# Patient Record
Sex: Female | Born: 1985 | Race: White | Hispanic: No | Marital: Married | Smoking: Never smoker
Health system: Southern US, Community
[De-identification: ages and names within clinical notes are randomized; demographics above are authoritative.]

## PROBLEM LIST (undated history)

## (undated) DIAGNOSIS — I82409 Acute embolism and thrombosis of unspecified deep veins of unspecified lower extremity: Secondary | ICD-10-CM

## (undated) DIAGNOSIS — T7840XA Allergy, unspecified, initial encounter: Secondary | ICD-10-CM

## (undated) DIAGNOSIS — E079 Disorder of thyroid, unspecified: Secondary | ICD-10-CM

## (undated) DIAGNOSIS — I2699 Other pulmonary embolism without acute cor pulmonale: Secondary | ICD-10-CM

## (undated) HISTORY — DX: Allergy, unspecified, initial encounter: T78.40XA

---
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
## 2014-10-13 HISTORY — PX: LASIK: SHX215

## 2014-10-13 HISTORY — PX: LAPAROSCOPY: SHX197

## 2016-10-13 DIAGNOSIS — I82409 Acute embolism and thrombosis of unspecified deep veins of unspecified lower extremity: Secondary | ICD-10-CM

## 2016-10-13 HISTORY — DX: Acute embolism and thrombosis of unspecified deep veins of unspecified lower extremity: I82.409

## 2017-09-01 ENCOUNTER — Other Ambulatory Visit: Payer: Self-pay | Admitting: Family Medicine

## 2017-09-01 DIAGNOSIS — I824Y1 Acute embolism and thrombosis of unspecified deep veins of right proximal lower extremity: Secondary | ICD-10-CM

## 2017-09-11 ENCOUNTER — Encounter (INDEPENDENT_AMBULATORY_CARE_PROVIDER_SITE_OTHER): Payer: Self-pay

## 2017-09-11 ENCOUNTER — Ambulatory Visit
Admission: RE | Admit: 2017-09-11 | Discharge: 2017-09-11 | Disposition: A | Discharge status: Home / Self Care | Payer: Commercial Managed Care - PPO | Source: Ambulatory Visit | Attending: Family Medicine | Admitting: Family Medicine

## 2017-09-11 DIAGNOSIS — I824Y1 Acute embolism and thrombosis of unspecified deep veins of right proximal lower extremity: Secondary | ICD-10-CM

## 2017-09-11 MED ORDER — GADOBENATE DIMEGLUMINE 529 MG/ML IV SOLN: 20.0000 mL | Freq: Once | INTRAVENOUS | Status: DC | PRN

## 2018-03-18 ENCOUNTER — Encounter (HOSPITAL_COMMUNITY): Payer: Self-pay | Admitting: Family Medicine

## 2018-03-18 ENCOUNTER — Ambulatory Visit (HOSPITAL_COMMUNITY)
Admission: EM | Admit: 2018-03-18 | Discharge: 2018-03-18 | Disposition: A | Discharge status: Home / Self Care | Payer: Commercial Managed Care - PPO | Attending: Family Medicine | Admitting: Family Medicine

## 2018-03-18 DIAGNOSIS — R42 Dizziness and giddiness: Secondary | ICD-10-CM | POA: Diagnosis not present

## 2018-03-18 DIAGNOSIS — Z3202 Encounter for pregnancy test, result negative: Secondary | ICD-10-CM

## 2018-03-18 HISTORY — DX: Other pulmonary embolism without acute cor pulmonale: I26.99

## 2018-03-18 HISTORY — DX: Disorder of thyroid, unspecified: E07.9

## 2018-03-18 LAB — POCT PREGNANCY, URINE: PREG TEST UR: NEGATIVE

## 2018-03-18 LAB — POCT I-STAT, CHEM 8
BUN: 14 mg/dL (ref 6–20)
CALCIUM ION: 1.29 mmol/L (ref 1.15–1.40)
CHLORIDE: 105 mmol/L (ref 101–111)
Creatinine, Ser: 0.8 mg/dL (ref 0.44–1.00)
Glucose, Bld: 87 mg/dL (ref 65–99)
HEMATOCRIT: 37 % (ref 36.0–46.0)
Hemoglobin: 12.6 g/dL (ref 12.0–15.0)
POTASSIUM: 4.2 mmol/L (ref 3.5–5.1)
SODIUM: 143 mmol/L (ref 135–145)
TCO2: 27 mmol/L (ref 22–32)

## 2018-03-18 NOTE — Discharge Instructions (Signed)
Please increase your fluid intake. Rest, activity as tolerated.  If you develop any worsening of symptoms, increased dizziness, increased leg pain, headache, chest pain , shortness of breath , confusion, weakness or otherwise worsening please return or go to Er.

## 2018-03-18 NOTE — ED Provider Notes (Signed)
[facility]    CSN: [id number] Arrival date & time: 03/18/18  1808     History   Chief Complaint Chief Complaint  Patient presents with  . Dizziness    HPI Holly Fry is a 32 y.o. female.   Holly Fry presents with complaints of sensation of light headedness and "wooziness," which started over the past 36 hours and is worse with activity. 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 She is on xarelto. 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 Has been on xarelto since. No fevers. Has been eating and drinking. Has been on vicodin for pain, last took this morning. Denies constipation. Slight headache but has been under increased stress. No cough, shortness of breath , chest pain , URI symptoms, nausea, vomiting, diarrhea. No increased pain, redness, swelling or sensation changes to leg. Feels it has been improving well. Wearing compression sleeve to left leg as directed. Hx of PE as well as thyroid disease.   ROS per HPI.      Past Medical History:  Diagnosis Date  . PE (pulmonary thromboembolism) (HCC)   . Thyroid disease     There are no active problems to display for this patient.   History reviewed. No pertinent surgical history.  OB History   None      Home Medications    Prior to Admission medications   Medication Sig Start Date End Date Taking? Authorizing Provider  levothyroxine (SYNTHROID, LEVOTHROID) 112 MCG tablet Take 112 mcg by mouth daily before breakfast.   Yes [provider]  meloxicam (MOBIC) 7.5 MG tablet Take 7.5 mg by mouth daily.   Yes [provider]  rivaroxaban (XARELTO) 20 MG TABS tablet Take 20 mg by mouth daily with supper.   Yes [provider]    Family History History reviewed. No pertinent family history.  Social History Social History   Tobacco Use  . Smoking status: Never Smoker  . Smokeless tobacco: Never Used  Substance Use Topics  . Alcohol use:  Not on file  . Drug use: Not on file     Allergies   Patient has no known allergies.   Review of Systems Review of Systems   Physical Exam Triage Vital Signs ED Triage Vitals  Enc Vitals Group     BP 03/18/18 1903 124/62     Pulse Rate 03/18/18 1903 74     Resp 03/18/18 1903 18     Temp 03/18/18 1903 98.5 F (36.9 C)     Temp Source 03/18/18 1903 Oral     SpO2 03/18/18 1903 100 %     Weight --      Height --      Head Circumference --      Peak Flow --      Pain Score 03/18/18 1904 4     Pain Loc --      Pain Edu? --      Excl. in GC? --    Orthostatic VS for the past 24 hrs:  BP- Lying Pulse- Lying BP- Sitting Pulse- Sitting BP- Standing at 0 minutes Pulse- Standing at 0 minutes  03/18/18 2037 107/57 63 110/65 64 109/61 84    Updated Vital Signs BP 124/62   Pulse 74   Temp 98.5 F (36.9 C) (Oral)   Resp 18   LMP 02/23/2018   SpO2 100%    Physical Exam  Constitutional: She is  oriented to person, place, and time. She appears well-developed and well-nourished. No distress.  HENT:  Head: Normocephalic and atraumatic.  Right Ear: Hearing, tympanic membrane and ear canal normal.  Left Ear: Hearing, tympanic membrane and ear canal normal.  Nose: Nose normal.  Mouth/Throat: Uvula is midline, oropharynx is clear and moist and mucous membranes are normal.  Eyes: Pupils are equal, round, and reactive to light. Conjunctivae and EOM are normal.  Cardiovascular: Normal rate, regular rhythm and normal heart sounds.  Pulmonary/Chest: Effort normal and breath sounds normal.  Musculoskeletal:  Healing bruising to left thigh; compression stocking in place. Skin visualized through stocking without any swelling or redness, non tender  Neurological: She is alert and oriented to person, place, and time. She has normal strength. No cranial nerve deficit or sensory deficit. She displays a negative Romberg sign. Coordination normal.  Felt somewhat increased dizzy with romberg but  was able to tolerate  Skin: Skin is warm and dry.     UC Treatments / Results  Labs (all labs ordered are listed, but only abnormal results are displayed) Labs Reviewed  POCT PREGNANCY, URINE  POCT I-STAT, CHEM 8    EKG None  Radiology No results found.  Procedures Procedures (including critical care time)  Medications Ordered in UC Medications - No data to display  Initial Impression / Assessment and Plan / UC Course  I have reviewed the triage vital signs and the nursing notes.  Pertinent labs & imaging results that were available during my care of the patient were reviewed by me and considered in my medical decision making (see chart for details).     Chem 8 reassuring. Afebrile. Without tachycardia or hypoxia. No increased pain to leg or chest. Orthostatic BP WNL, slight elevation in HR with standing. Question slight dehydration, has been on vicodin. Without redflag findings at this time. Discussed pe, dvt, bleed, dehydration, sinusitis as differentials. .Return precautions provided and discussed at length. Patient verbalized understanding and agreeable to plan.  Ambulatory out of clinic without difficulty.    Final Clinical Impressions(s) / UC Diagnoses   Final diagnoses:  Dizziness     Discharge Instructions     Please increase your fluid intake. Rest, activity as tolerated.  If you develop any worsening of symptoms, increased dizziness, increased leg pain, headache, chest pain , shortness of breath , confusion, weakness or otherwise worsening please return or go to Er.     ED Prescriptions    None     Controlled Substance Prescriptions [place] Controlled Substance Registry consulted? Not Applicable   Georgetta Haber, NP 03/18/18 2124

## 2018-03-18 NOTE — ED Triage Notes (Addendum)
Pt here for lightheadedness since about 36 hours ago. She had varicose vein surgery last week. Denies any chest pain or SOB. She is wearing a compression stocking. She has a hx of PE that occurred last June. She is on xarelto.

## 2018-04-02 ENCOUNTER — Encounter: Payer: Self-pay | Admitting: Medical

## 2018-04-02 ENCOUNTER — Ambulatory Visit: Payer: Commercial Managed Care - PPO | Admitting: Medical

## 2018-04-02 VITALS — BP 115/62 | HR 77 | Temp 98.1°F | Resp 16 | Ht 69.0 in | Wt 202.8 lb

## 2018-04-02 DIAGNOSIS — R1013 Epigastric pain: Secondary | ICD-10-CM | POA: Diagnosis not present

## 2018-04-02 DIAGNOSIS — E039 Hypothyroidism, unspecified: Secondary | ICD-10-CM

## 2018-04-02 DIAGNOSIS — Z86718 Personal history of other venous thrombosis and embolism: Secondary | ICD-10-CM | POA: Diagnosis not present

## 2018-04-02 DIAGNOSIS — R5383 Other fatigue: Secondary | ICD-10-CM | POA: Diagnosis not present

## 2018-04-02 MED ORDER — ONDANSETRON 8 MG PO TBDP: 8.0000 mg | ORAL_TABLET | Freq: Three times a day (TID) | ORAL | 0 refills | Status: AC | PRN

## 2018-04-02 MED ORDER — RANITIDINE HCL 150 MG PO CAPS: 150.0000 mg | ORAL_CAPSULE | Freq: Two times a day (BID) | ORAL | 0 refills | Status: DC

## 2018-04-02 NOTE — Progress Notes (Signed)
Subjective:    Patient ID: Holly Fry, female    DOB: 06-10-1986, 32 y.o.   MRN: [id number]  HPI  Pt her for first time.  Pt is from [place] originally. Just moved her last August.  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 Husband works for [employer], Pt not exercising. Pt states her diet is improved recently.    Pt has low thyroid since 2016. Pt last had thyroid level done 6 months ago. Pt is on 112 microgram of levothyroxine.   Pt has history of dvt following bed rest 10 weeks, c-section and oral contraceptive. Pt was on xarelto since last June. She saw hematologist and work up cause for DVT was negative.  Pt had saphenous vein procedures both legs to reduce chance of DVT again.(pt states procedure done with [facility] vein specialist) States prior dvt left iliac vessel region.  Pt states former hematologist states she should continue on xarelto but unclear for how long. Currently has 90 tab rx of xarelto.   Pt recently past 2 weeks  had severe gi symptoms almost immediately after eating fried foods. Pain will be epigastric and some to right  upper quadrant. Some nausea. Only on time did she vomit. Tuesday some diarrhea but that normal formed stools by Thursday.   Pt states pain abdomen after eating greasy/fried foods lasted 5-6 hours twice over past 2 weeks. Last time that happened Wednesday.   LMP- started past Sunday. Still on cycle      Review of Systems  Constitutional: Negative for chills, fatigue and fever.  Respiratory: Negative for cough, chest tightness, shortness of breath and wheezing.   Cardiovascular: Negative for chest pain and palpitations.  Gastrointestinal: Positive for nausea. Negative for abdominal distention, abdominal pain, blood in stool, diarrhea, rectal pain and vomiting.       Residual abdomen pain now.  No nausea now.  Genitourinary: Negative for dysuria, flank pain and frequency.  Musculoskeletal: Negative for back pain.  Neurological: Negative for facial asymmetry and  light-headedness.  Hematological: Negative for adenopathy. Does not bruise/bleed easily.  Psychiatric/Behavioral: Negative for behavioral problems, confusion and dysphoric mood.   Past Medical History:  Diagnosis Date  . Allergy    mild allergies in [place].  . PE (pulmonary thromboembolism) (HCC)   . Thyroid disease      Social History   Socioeconomic History  . Marital status: Married    Spouse name: Not on file  . Number of children: Not on file  . Years of education: Not on file  . Highest education level: Not on file  Occupational History  . Not on file  Social Needs  . Financial resource strain: Not on file  . Food insecurity:    Worry: Not on file    Inability: Not on file  . Transportation needs:    Medical: Not on file    Non-medical: Not on file  Tobacco Use  . Smoking status: Never Smoker  . Smokeless tobacco: Never Used  Substance and Sexual Activity  . Alcohol use: Yes    Comment: very rare alchohol use.  . Drug use: Never  . Sexual activity: Yes  Lifestyle  . Physical activity:    Days per week: Not on file    Minutes per session: Not on file  . Stress: Not on file  Relationships  . Social connections:    Talks on phone: Not on file    Gets together: Not on file    Attends religious service: Not on file  Active member of club or organization: Not on file    Attends meetings of clubs or organizations: Not on file    Relationship status: Not on file  . Intimate partner violence:    Fear of current or ex partner: Not on file    Emotionally abused: Not on file    Physically abused: Not on file    Forced sexual activity: Not on file  Other Topics Concern  . Not on file  Social History Narrative  . Not on file    History reviewed. No pertinent surgical history.  History reviewed. No pertinent family history.  No Known Allergies  Current Outpatient Medications on File Prior to Visit  Medication Sig Dispense Refill  . levothyroxine (SYNTHROID,  LEVOTHROID) 112 MCG tablet Take 112 mcg by mouth daily before breakfast.    . rivaroxaban (XARELTO) 20 MG TABS tablet Take 20 mg by mouth daily with supper.     No current facility-administered medications on file prior to visit.     BP 115/62   Pulse 77   Temp 98.1 F (36.7 C) (Oral)   Resp 16   Ht 5\' 9"  (1.753 m)   Wt 202 lb 12.8 oz (92 kg)   LMP 03/28/2018   SpO2 98%   BMI 29.95 kg/m      Objective:   Physical Exam   General Mental Status- Alert. General Appearance- Not in acute distress.   Skin General: Color- Normal Color. Moisture- Normal Moisture.  Neck Carotid Arteries- Normal color. Moisture- Normal Moisture. No carotid bruits. No JVD.  Chest and Lung Exam Auscultation: Breath Sounds:-Normal.  Cardiovascular Auscultation:Rythm- Regular. Murmurs & Other Heart Sounds:Auscultation of the heart reveals- No Murmurs.  Abdomen Inspection:-Inspeection Normal. Palpation/Percussion:Note:No mass. Palpation and Percussion of the abdomen reveal- faint epigastric and rt upper quadrant Tender, Non Distended + BS, no rebound or guarding.   Neurologic Cranial Nerve exam:- CN III-XII intact(No nystagmus), symmetric smile. Strength:- 5/5 equal and symmetric strength both upper and lower extremities.     Assessment & Plan:  For your low thyroid, we are getting a TSH level today.  If level is normal range then will give you prescriptions at same current dosage.  For history of DVT and PE, I asked that you sign a release form so we can get records from hematologist in [place].  Also please sign a release of information form so we can get surgical note from [place] vein specialist.  Regarding your fatigue, I ordered labs.  We will follow those and let you know those results.  For your abdomen pain after eating greasy foods, I ordered CBC, CMP, amylase, lipase, H. pylori breath test, and abdomen ultrasound.  Please go downstairs to radiology department to schedule the  ultrasound study.  If your abdomen pain flares again with eating meals then can use ranitidine as I prescribed and Zofran(for nausea or vomiting).  Any extreme/severe abdomen pain despite above measures then recommend ED evaluation.  Please try to avoid all greasy/fatty foods.  Follow-up in 10 to 14 days or as needed.  Esperanza Richters, PA-C

## 2018-04-02 NOTE — Patient Instructions (Signed)
Nice to meet you today.  For your low thyroid, we are getting a TSH level today.  If level is normal range then will give you prescriptions at same current dosage.  For history of DVT and PE, I asked that you sign a release form so we can get records from hematologist in [place].  Also please sign a release of information form so we can get surgical note from [place] vein specialist.  Regarding your fatigue, I ordered labs.  We will follow those and let you know those results.  For your abdomen pain after eating greasy foods, I ordered CBC, CMP, amylase, lipase, H. pylori breath test, and abdomen ultrasound.  Please go downstairs to radiology department to schedule the ultrasound study.  If your abdomen pain flares again with eating meals then can use ranitidine as I prescribed and Zofran(for nausea or vomiting).  Any extreme/severe abdomen pain despite above measures then recommend ED evaluation.  Please try to avoid all greasy/fatty foods.  Follow-up in 10 to 14 days or as needed.

## 2018-04-03 ENCOUNTER — Ambulatory Visit (HOSPITAL_BASED_OUTPATIENT_CLINIC_OR_DEPARTMENT_OTHER)
Admission: RE | Admit: 2018-04-03 | Discharge: 2018-04-03 | Disposition: A | Discharge status: Home / Self Care | Payer: Commercial Managed Care - PPO | Source: Ambulatory Visit | Attending: Medical | Admitting: Medical

## 2018-04-03 ENCOUNTER — Encounter (HOSPITAL_BASED_OUTPATIENT_CLINIC_OR_DEPARTMENT_OTHER): Payer: Self-pay

## 2018-04-03 DIAGNOSIS — R1013 Epigastric pain: Secondary | ICD-10-CM

## 2018-04-03 DIAGNOSIS — N281 Cyst of kidney, acquired: Secondary | ICD-10-CM | POA: Diagnosis not present

## 2018-04-03 HISTORY — DX: Acute embolism and thrombosis of unspecified deep veins of unspecified lower extremity: I82.409

## 2018-04-05 ENCOUNTER — Other Ambulatory Visit: Payer: Self-pay | Admitting: Medical

## 2018-04-05 ENCOUNTER — Telehealth: Payer: Self-pay

## 2018-04-05 MED ORDER — LEVOTHYROXINE SODIUM 112 MCG PO TABS: 112.0000 ug | ORAL_TABLET | Freq: Every day | ORAL | 5 refills | Status: DC

## 2018-04-05 MED ORDER — LEVOTHYROXINE SODIUM 112 MCG PO TABS: 112.0000 ug | ORAL_TABLET | Freq: Every day | ORAL | 0 refills | Status: DC

## 2018-04-05 NOTE — Addendum Note (Signed)
Addended by: Orlene OchRENCE, Luka Reisch N on: 04/05/2018 03:00 PM   Modules accepted: Orders

## 2018-04-05 NOTE — Addendum Note (Signed)
Addended by: Sandford Craze'SULLIVAN, Khylen Riolo on: 04/05/2018 07:20 PM   Modules accepted: Orders

## 2018-04-05 NOTE — Telephone Encounter (Signed)
Copied from CRM [id number]. Topic: Appointment Scheduling - Prior Auth Required for Appointment >> Mar 24, 2018  5:11 PM Jay SchlichterWeikart, Melissa J wrote: No appointment has been scheduled. Patient is requesting new patient appointment. Per scheduling protocol, this appointment requires a prior authorization prior to scheduling. She was referred by Lewis ShockAshly Chadwell , a current pt of Dr Abner GreenspanBlyth. Cb is [phone] If she does not want to take her, can she recommend someone who deals with hypo thyroidism   Route to department's PEC pool.    [place], Can you refer to another provider in the office? Thanks

## 2018-04-05 NOTE — Telephone Encounter (Signed)
Pt is scheduled with Esperanza Richtersdward Saguier 04/20/18 @ 8:40am

## 2018-04-05 NOTE — Telephone Encounter (Signed)
Request new prescription for levothyroxine 112 MCG tablet. Last refill was from a historical provider.  Provider Esperanza RichtersEdward Saguier, PA LOV 04/02/18 NOV  04/20/18  Pharmacy  [facility] # [id number]  [address] Pt has 10 tabs left, going out of town next week.  Last TSH  04/02/18

## 2018-04-07 LAB — CBC WITH DIFFERENTIAL/PLATELET
BASOS PCT: 0.9 %
Basophils Absolute: 52 cells/uL (ref 0–200)
EOS PCT: 5.5 %
Eosinophils Absolute: 319 cells/uL (ref 15–500)
HCT: 35.6 % (ref 35.0–45.0)
Hemoglobin: 11.6 g/dL — ABNORMAL LOW (ref 11.7–15.5)
Lymphs Abs: 2204 cells/uL (ref 850–3900)
MCH: 24.9 pg — ABNORMAL LOW (ref 27.0–33.0)
MCHC: 32.6 g/dL (ref 32.0–36.0)
MCV: 76.6 fL — AB (ref 80.0–100.0)
MONOS PCT: 10.4 %
MPV: 10.2 fL (ref 7.5–12.5)
NEUTROS PCT: 45.2 %
Neutro Abs: 2622 cells/uL (ref 1500–7800)
PLATELETS: 445 10*3/uL — AB (ref 140–400)
RBC: 4.65 10*6/uL (ref 3.80–5.10)
RDW: 17 % — AB (ref 11.0–15.0)
TOTAL LYMPHOCYTE: 38 %
WBC mixed population: 603 cells/uL (ref 200–950)
WBC: 5.8 10*3/uL (ref 3.8–10.8)

## 2018-04-07 LAB — COMPREHENSIVE METABOLIC PANEL
AG RATIO: 1.5 (calc) (ref 1.0–2.5)
ALKALINE PHOSPHATASE (APISO): 50 U/L (ref 33–115)
ALT: 11 U/L (ref 6–29)
AST: 15 U/L (ref 10–30)
Albumin: 4.3 g/dL (ref 3.6–5.1)
BILIRUBIN TOTAL: 0.2 mg/dL (ref 0.2–1.2)
BUN: 15 mg/dL (ref 7–25)
CALCIUM: 9.9 mg/dL (ref 8.6–10.2)
CO2: 18 mmol/L — AB (ref 20–32)
Chloride: 110 mmol/L (ref 98–110)
Creat: 0.76 mg/dL (ref 0.50–1.10)
GLUCOSE: 87 mg/dL (ref 65–99)
Globulin: 2.8 g/dL (calc) (ref 1.9–3.7)
Potassium: 4.3 mmol/L (ref 3.5–5.3)
Sodium: 145 mmol/L (ref 135–146)
Total Protein: 7.1 g/dL (ref 6.1–8.1)

## 2018-04-07 LAB — VITAMIN B12: VITAMIN B 12: 514 pg/mL (ref 200–1100)

## 2018-04-07 LAB — TSH: TSH: 2.49 mIU/L

## 2018-04-07 LAB — VITAMIN D 25 HYDROXY (VIT D DEFICIENCY, FRACTURES): Vit D, 25-Hydroxy: 32 ng/mL (ref 30–100)

## 2018-04-07 LAB — H. PYLORI BREATH TEST: H. PYLORI BREATH TEST: NOT DETECTED

## 2018-04-07 LAB — LIPASE: LIPASE: 41 U/L (ref 7–60)

## 2018-04-07 LAB — AMYLASE: Amylase: 65 U/L (ref 21–101)

## 2018-04-07 LAB — VITAMIN B1: VITAMIN B1 (THIAMINE): 16 nmol/L (ref 8–30)

## 2018-04-20 ENCOUNTER — Ambulatory Visit: Payer: Commercial Managed Care - PPO | Admitting: Medical

## 2018-04-20 ENCOUNTER — Telehealth: Payer: Self-pay | Admitting: Medical

## 2018-04-20 NOTE — Telephone Encounter (Signed)
Opened to review 

## 2018-04-20 NOTE — Telephone Encounter (Signed)
I met pt one time for new visit. Then saw in chart she is actually wanting to see Dr. Charlett Blake. Request passed to Dr. Charlett Blake. Will you coordinate with Princess and Dr. Charlett Blake if pt will see Dr. Charlett Blake. She was scheduled wth me and was a no show.  If Dr. Charlett Blake no accepting her then will you reach out to patient and see if her desire is to see a female? Would Dr. Edilia Bo or Lenna Sciara be other option.   Just requesting clarification as I saw her request to see Dr. Charlett Blake?

## 2018-04-21 NOTE — Telephone Encounter (Signed)
I am willing to see her since she is already in the office

## 2018-04-21 NOTE — Telephone Encounter (Signed)
Princess, Dr. B, can you confirm if you are willing to take this patient on? I will contact the patient based on your decision.

## 2018-04-27 NOTE — Telephone Encounter (Signed)
Called pt and she declined having Dr. Abner GreenspanBlyth as her provider. Pt states that Whole FoodsEdward Saguier was nice and she liked him. It also seems that she wouldn't be able to get in with Dr. Abner GreenspanBlyth if she needed her so she would rather stay with Ramon DredgeEdward.

## 2018-04-28 ENCOUNTER — Other Ambulatory Visit: Payer: Self-pay | Admitting: Medical

## 2018-04-30 ENCOUNTER — Encounter: Payer: Self-pay | Admitting: Medical

## 2018-05-13 ENCOUNTER — Ambulatory Visit (INDEPENDENT_AMBULATORY_CARE_PROVIDER_SITE_OTHER): Payer: Commercial Managed Care - PPO | Admitting: Medical

## 2018-05-13 ENCOUNTER — Telehealth: Payer: Self-pay | Admitting: Medical

## 2018-05-13 ENCOUNTER — Encounter: Payer: Self-pay | Admitting: Medical

## 2018-05-13 VITALS — BP 108/50 | HR 69 | Temp 97.7°F | Resp 16 | Ht 69.0 in | Wt 202.2 lb

## 2018-05-13 DIAGNOSIS — Z0000 Encounter for general adult medical examination without abnormal findings: Secondary | ICD-10-CM

## 2018-05-13 DIAGNOSIS — Z111 Encounter for screening for respiratory tuberculosis: Secondary | ICD-10-CM | POA: Diagnosis not present

## 2018-05-13 DIAGNOSIS — Z113 Encounter for screening for infections with a predominantly sexual mode of transmission: Secondary | ICD-10-CM | POA: Diagnosis not present

## 2018-05-13 DIAGNOSIS — D649 Anemia, unspecified: Secondary | ICD-10-CM

## 2018-05-13 LAB — CBC WITH DIFFERENTIAL/PLATELET
Basophils Absolute: 0.1 10*3/uL (ref 0.0–0.1)
Basophils Relative: 1 % (ref 0.0–3.0)
EOS ABS: 0.1 10*3/uL (ref 0.0–0.7)
EOS PCT: 2.7 % (ref 0.0–5.0)
HCT: 32.9 % — ABNORMAL LOW (ref 36.0–46.0)
HEMOGLOBIN: 10.4 g/dL — AB (ref 12.0–15.0)
Lymphocytes Relative: 38 % (ref 12.0–46.0)
Lymphs Abs: 1.9 10*3/uL (ref 0.7–4.0)
MCHC: 31.6 g/dL (ref 30.0–36.0)
MCV: 76 fl — ABNORMAL LOW (ref 78.0–100.0)
MONO ABS: 0.5 10*3/uL (ref 0.1–1.0)
Monocytes Relative: 10.7 % (ref 3.0–12.0)
Neutro Abs: 2.3 10*3/uL (ref 1.4–7.7)
Neutrophils Relative %: 47.6 % (ref 43.0–77.0)
Platelets: 446 10*3/uL — ABNORMAL HIGH (ref 150.0–400.0)
RBC: 4.32 Mil/uL (ref 3.87–5.11)
RDW: 17.2 % — ABNORMAL HIGH (ref 11.5–15.5)
WBC: 4.9 10*3/uL (ref 4.0–10.5)

## 2018-05-13 LAB — POC URINALSYSI DIPSTICK (AUTOMATED)
Bilirubin, UA: NEGATIVE
GLUCOSE UA: NEGATIVE
KETONES UA: NEGATIVE
Leukocytes, UA: NEGATIVE
Nitrite, UA: NEGATIVE
Protein, UA: NEGATIVE
RBC UA: NEGATIVE
SPEC GRAV UA: 1.02 (ref 1.010–1.025)
UROBILINOGEN UA: NEGATIVE U/dL — AB
pH, UA: 6 (ref 5.0–8.0)

## 2018-05-13 LAB — LIPID PANEL
CHOL/HDL RATIO: 4
CHOLESTEROL: 180 mg/dL (ref 0–200)
HDL: 48.6 mg/dL (ref >39.00)
LDL Cholesterol: 115 mg/dL — ABNORMAL HIGH (ref 0–99)
NonHDL: 131.05
TRIGLYCERIDES: 80 mg/dL (ref 0.0–149.0)
VLDL: 16 mg/dL (ref 0.0–40.0)

## 2018-05-13 LAB — COMPREHENSIVE METABOLIC PANEL
ALBUMIN: 4.2 g/dL (ref 3.5–5.2)
ALT: 13 U/L (ref 0–35)
AST: 16 U/L (ref 0–37)
Alkaline Phosphatase: 47 U/L (ref 39–117)
BUN: 11 mg/dL (ref 6–23)
CO2: 28 mEq/L (ref 19–32)
CREATININE: 0.74 mg/dL (ref 0.40–1.20)
Calcium: 9.3 mg/dL (ref 8.4–10.5)
Chloride: 105 mEq/L (ref 96–112)
GFR: 97.75 mL/min (ref >60.00)
Glucose, Bld: 88 mg/dL (ref 70–99)
Potassium: 3.8 mEq/L (ref 3.5–5.1)
SODIUM: 138 meq/L (ref 135–145)
TOTAL PROTEIN: 7.3 g/dL (ref 6.0–8.3)
Total Bilirubin: 0.3 mg/dL (ref 0.2–1.2)

## 2018-05-13 NOTE — Patient Instructions (Addendum)
For you wellness exam today I have ordered cbc, cmp, lipid panel, ua and hiv.  Vaccine not given today. Tdap likely up to date. Records in [place].  Recommend exercise and healthy diet.  We will let you know lab results as they come in.  Follow up date appointment will be determined after lab review.   Form filled out clearing for coaching. Will hold pending tb test result.   Preventive Care 18-39 Years, Female Preventive care refers to lifestyle choices and visits with your health care provider that can promote health and wellness. What does preventive care include?  A yearly physical exam. This is also called an annual well check.  Dental exams once or twice a year.  Routine eye exams. Ask your health care provider how often you should have your eyes checked.  Personal lifestyle choices, including: ? Daily care of your teeth and gums. ? Regular physical activity. ? Eating a healthy diet. ? Avoiding tobacco and drug use. ? Limiting alcohol use. ? Practicing safe sex. ? Taking vitamin and mineral supplements as recommended by your health care provider. What happens during an annual well check? The services and screenings done by your health care provider during your annual well check will depend on your age, overall health, lifestyle risk factors, and family history of disease. Counseling Your health care provider may ask you questions about your:  Alcohol use.  Tobacco use.  Drug use.  Emotional well-being.  Home and relationship well-being.  Sexual activity.  Eating habits.  Work and work Statistician.  Method of birth control.  Menstrual cycle.  Pregnancy history.  Screening You may have the following tests or measurements:  Height, weight, and BMI.  Diabetes screening. This is done by checking your blood sugar (glucose) after you have not eaten for a while (fasting).  Blood pressure.  Lipid and cholesterol levels. These may be checked every 5 years  starting at age 73.  Skin check.  Hepatitis C blood test.  Hepatitis B blood test.  Sexually transmitted disease (STD) testing.  BRCA-related cancer screening. This may be done if you have a family history of breast, ovarian, tubal, or peritoneal cancers.  Pelvic exam and Pap test. This may be done every 3 years starting at age 77. Starting at age 18, this may be done every 5 years if you have a Pap test in combination with an HPV test.  Discuss your test results, treatment options, and if necessary, the need for more tests with your health care provider. Vaccines Your health care provider may recommend certain vaccines, such as:  Influenza vaccine. This is recommended every year.  Tetanus, diphtheria, and acellular pertussis (Tdap, Td) vaccine. You may need a Td booster every 10 years.  Varicella vaccine. You may need this if you have not been vaccinated.  HPV vaccine. If you are 47 or younger, you may need three doses over 6 months.  Measles, mumps, and rubella (MMR) vaccine. You may need at least one dose of MMR. You may also need a second dose.  Pneumococcal 13-valent conjugate (PCV13) vaccine. You may need this if you have certain conditions and were not previously vaccinated.  Pneumococcal polysaccharide (PPSV23) vaccine. You may need one or two doses if you smoke cigarettes or if you have certain conditions.  Meningococcal vaccine. One dose is recommended if you are age 85-21 years and a first-year college student living in a residence hall, or if you have one of several medical conditions. You may also need  additional booster doses.  Hepatitis A vaccine. You may need this if you have certain conditions or if you travel or work in places where you may be exposed to hepatitis A.  Hepatitis B vaccine. You may need this if you have certain conditions or if you travel or work in places where you may be exposed to hepatitis B.  Haemophilus influenzae type b (Hib) vaccine. You  may need this if you have certain risk factors.  Talk to your health care provider about which screenings and vaccines you need and how often you need them. This information is not intended to replace advice given to you by your health care provider. Make sure you discuss any questions you have with your health care provider. Document Released: 11/25/2001 Document Revised: 06/18/2016 Document Reviewed: 07/31/2015 Elsevier Interactive Patient Education  [employer].

## 2018-05-13 NOTE — Progress Notes (Signed)
Subjective:    Patient ID: Holly Fry, female    DOB: [birth year], 32 y.o.   MRN: [id number]  HPI  Pt in for CPE. 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  Pt is fasting today.   Pt states not currently exercising. But has exercised in past with no syncope, chest pain or shortness of breath. 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 No problems doing activities.   Pt has been evaluated for DVT. Clot factors negative. 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 Seeing vascular surgeon, on xarelto and told by specialist not to use any contraception.   She needs physical for general health and clearance for coaching.  Pt states she thinks she is up to date on tdap. Prior records in [place]. Pap smear done in [place] as well.    Review of Systems  Constitutional: Negative for chills, fatigue and fever.  HENT: Negative for congestion and drooling.   Respiratory: Negative for cough, chest tightness, shortness of breath and wheezing.   Cardiovascular: Negative for chest pain and palpitations.  Gastrointestinal: Negative for abdominal distention, abdominal pain, constipation, diarrhea and nausea.  Musculoskeletal: Negative for back pain, myalgias and neck stiffness.  Skin: Negative for rash.  Neurological: Negative for dizziness, syncope, speech difficulty, weakness, numbness and headaches.  Hematological: Negative for adenopathy. Does not bruise/bleed easily.  Psychiatric/Behavioral: Negative for behavioral problems and confusion. The patient is not nervous/anxious.     Past Medical History:  Diagnosis Date  . Allergy    mild allergies in [place].  Marland Kitchen. DVT (deep venous thrombosis) (HCC) 2018   in Right Hip area  . PE (pulmonary thromboembolism) (HCC)   . Thyroid disease      Social History   Socioeconomic History  . Marital status: Married    Spouse name: Not on file  . Number of children: Not on file  . Years of education: Not on file  .  Highest education level: Not on file  Occupational History  . Not on file  Social Needs  . Financial resource strain: Not on file  . Food insecurity:    Worry: Not on file    Inability: Not on file  . Transportation needs:    Medical: Not on file    Non-medical: Not on file  Tobacco Use  . Smoking status: Never Smoker  . Smokeless tobacco: Never Used  Substance and Sexual Activity  . Alcohol use: Yes    Comment: very rare alchohol use.  . Drug use: Never  . Sexual activity: Yes  Lifestyle  . Physical activity:    Days per week: Not on file    Minutes per session: Not on file  . Stress: Not on file  Relationships  . Social connections:    Talks on phone: Not on file    Gets together: Not on file    Attends religious service: Not on file    Active member of club or organization: Not on file    Attends meetings of clubs or organizations: Not on file    Relationship status: Not on file  . Intimate partner violence:    Fear of current or ex partner: Not on file    Emotionally abused: Not on file    Physically abused: Not on file    Forced sexual activity: Not on file  Other Topics Concern  . Not on file  Social History Narrative  . Not on file    Past Surgical History:  Procedure Laterality Date  . CESAREAN SECTION  2017  . LAPAROSCOPY  2016   endometriosis  . LASIK Bilateral 2016  . WISDOM TOOTH EXTRACTION  2007    No family history on file.  No Known Allergies  Current Outpatient Medications on File Prior to Visit  Medication Sig Dispense Refill  . levothyroxine (SYNTHROID, LEVOTHROID) 112 MCG tablet Take 1 tablet (112 mcg total) by mouth daily before breakfast. 30 tablet 5  . ondansetron (ZOFRAN ODT) 8 MG disintegrating tablet Take 1 tablet (8 mg total) by mouth every 8 (eight) hours as needed for nausea or vomiting. 20 tablet 0  . ranitidine (ZANTAC) 150 MG capsule TAKE 1 CAPSULE BY MOUTH TWICE A DAY 60 capsule 0  . rivaroxaban (XARELTO) 20 MG TABS tablet  Take 20 mg by mouth daily with supper.     No current facility-administered medications on file prior to visit.     BP (!) 108/50   Pulse 69   Temp 97.7 F (36.5 C) (Oral)   Resp 16   Ht 5\' 9"  (1.753 m)   Wt 202 lb 3.2 oz (91.7 kg)   SpO2 100%   BMI 29.86 kg/m       Objective:   Physical Exam  General Mental Status- Alert. General Appearance- Not in acute distress.   Skin General: Color- Normal Color. Moisture- Normal Moisture.  Neck Carotid Arteries- Normal color. Moisture- Normal Moisture. No carotid bruits. No JVD.  Chest and Lung Exam Auscultation: Breath Sounds:-Normal.  Cardiovascular Auscultation:Rythm- Regular. Murmurs & Other Heart Sounds:Auscultation of the heart reveals- No Murmurs.  Abdomen Inspection:-Inspeection Normal. Palpation/Percussion:Note:No mass. Palpation and Percussion of the abdomen reveal- Non Tender, Non Distended + BS, no rebound or guarding.  Neurologic Cranial Nerve exam:- CN III-XII intact(No nystagmus), symmetric smile. Strength:- 5/5 equal and symmetric strength both upper and lower extremities.     Assessment & Plan:  For you wellness exam today I have ordered cbc, cmp, lipid panel, ua and hiv.  Vaccine not given today. Tdap likely up to date. Records in [place].  Recommend exercise and healthy diet.  We will let you know lab results as they come in.  Follow up date appointment will be determined after lab review.   Form filled out clearing for coaching.  Esperanza Richters, PA-C

## 2018-05-13 NOTE — Telephone Encounter (Signed)
Future labs for anemia placed. 

## 2018-05-15 LAB — QUANTIFERON-TB GOLD PLUS
Mitogen-NIL: >10 IU/mL
NIL: 0.02 IU/mL
QuantiFERON-TB Gold Plus: NEGATIVE
TB1-NIL: 0 IU/mL
TB2-NIL: 0.01 IU/mL

## 2018-05-15 LAB — HIV ANTIBODY (ROUTINE TESTING W REFLEX): HIV: NONREACTIVE

## 2018-05-25 ENCOUNTER — Other Ambulatory Visit: Payer: Self-pay | Admitting: Medical

## 2018-07-22 ENCOUNTER — Other Ambulatory Visit: Payer: Self-pay | Admitting: Family Medicine

## 2018-07-22 DIAGNOSIS — I862 Pelvic varices: Secondary | ICD-10-CM

## 2018-07-29 ENCOUNTER — Ambulatory Visit
Admission: RE | Admit: 2018-07-29 | Discharge: 2018-07-29 | Disposition: A | Discharge status: Home / Self Care | Payer: Commercial Managed Care - PPO | Source: Ambulatory Visit | Attending: Family Medicine | Admitting: Family Medicine

## 2018-07-29 DIAGNOSIS — I862 Pelvic varices: Secondary | ICD-10-CM

## 2018-07-29 MED ORDER — IOPAMIDOL (ISOVUE-300) INJECTION 61%
125.0000 mL | Freq: Once | INTRAVENOUS | Status: AC | PRN
  Administered 2018-07-29: 125 mL via INTRAVENOUS

## 2018-10-30 ENCOUNTER — Other Ambulatory Visit: Payer: Self-pay | Admitting: Family

## 2018-10-31 NOTE — Telephone Encounter (Signed)
Refilled her thyroid medication. But would you ask her to follow up within 2 months. On last lab review  Wanted to recheck anemia labs as she was anemia on last lab draw. Also would repeat tsh study

## 2018-11-01 NOTE — Telephone Encounter (Signed)
LVM for pt to schedule a fu appt with Provider Saguier in 2 months.

## 2018-12-10 IMAGING — US US ABDOMEN COMPLETE
1 series · 14 of 25 positions shown · non-contrast
Comparison: None.

CLINICAL DATA: Epigastric pain after fatty meals. Two episodes of
nausea and vomiting with diarrhea and abdominal pain over the past 9
days.

EXAM:
ABDOMEN ULTRASOUND COMPLETE

[Series 1: us abdomen complete · 0.25mm/px · 14 of 111 slices shown]
[im 1/111]
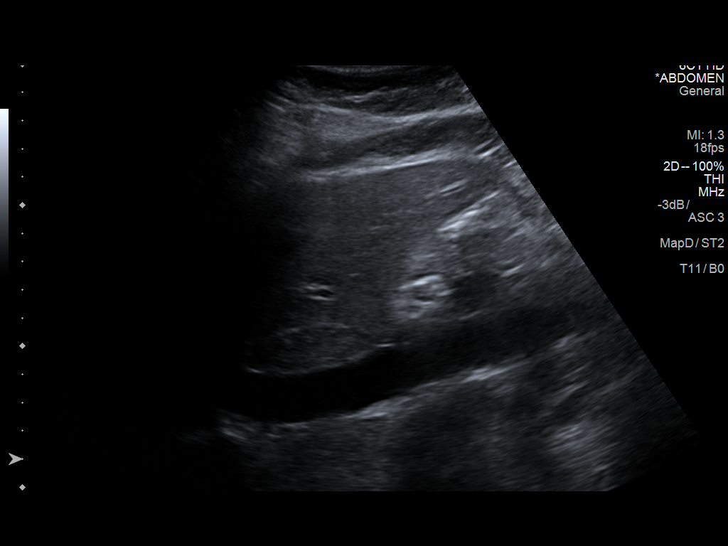
[im 10/111]
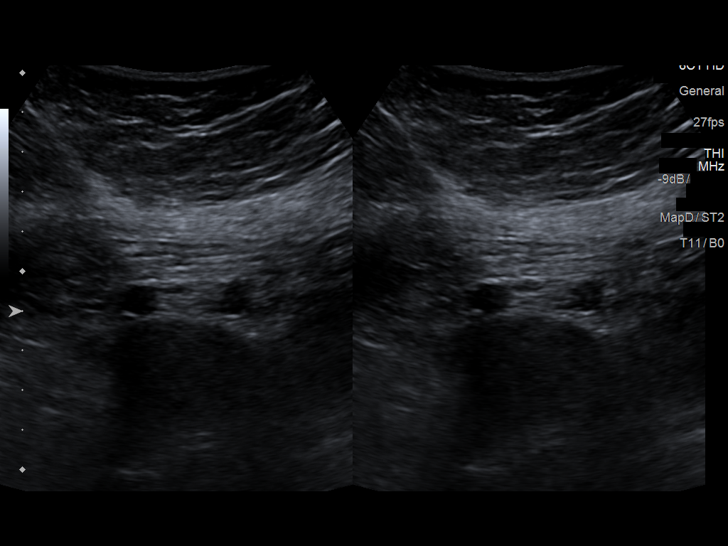
[im 19/111]
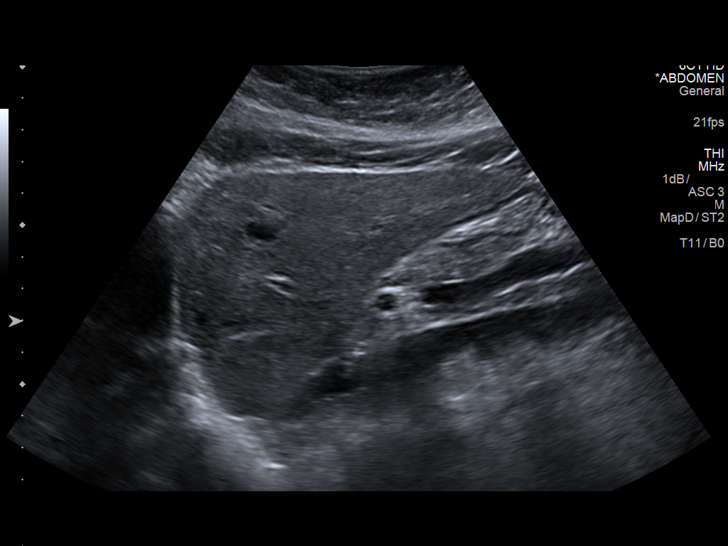
[im 28/111]
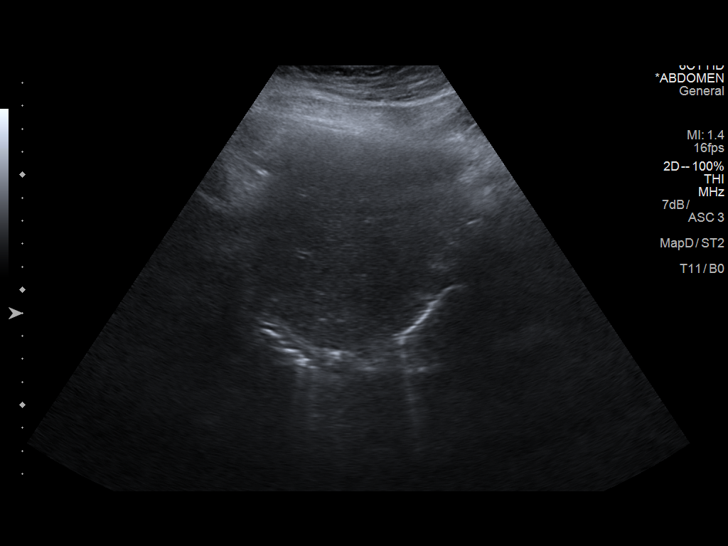
[im 37/111]
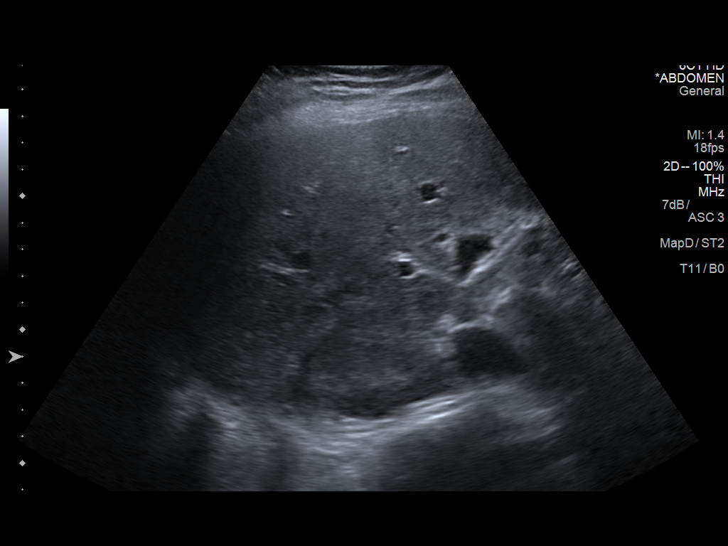
[im 42/111]
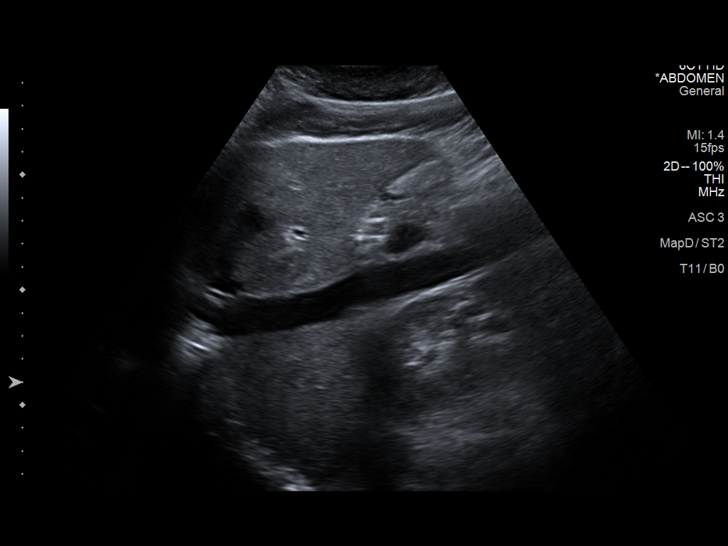
[im 51/111]
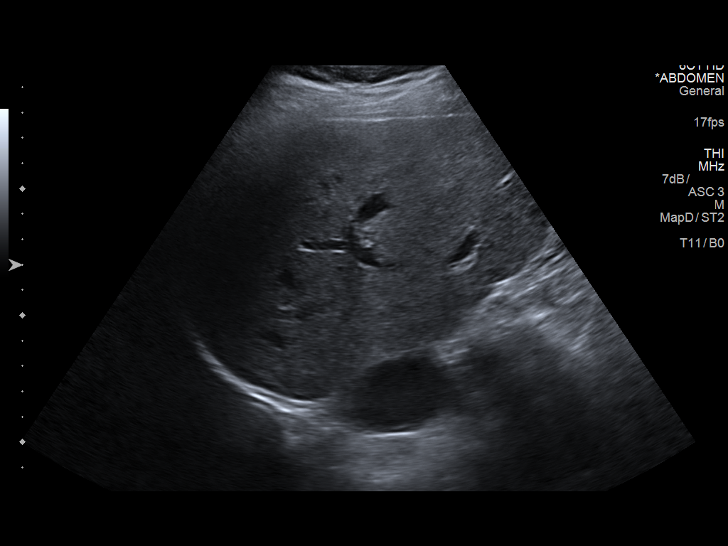
[im 60/111]
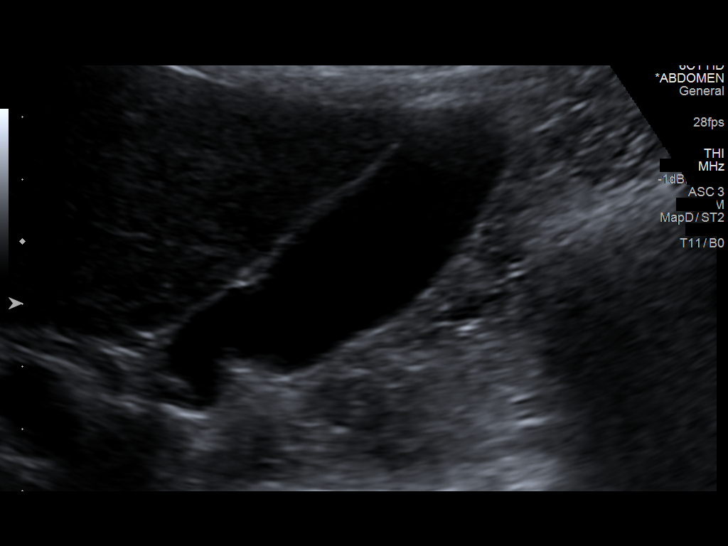
[im 69/111]
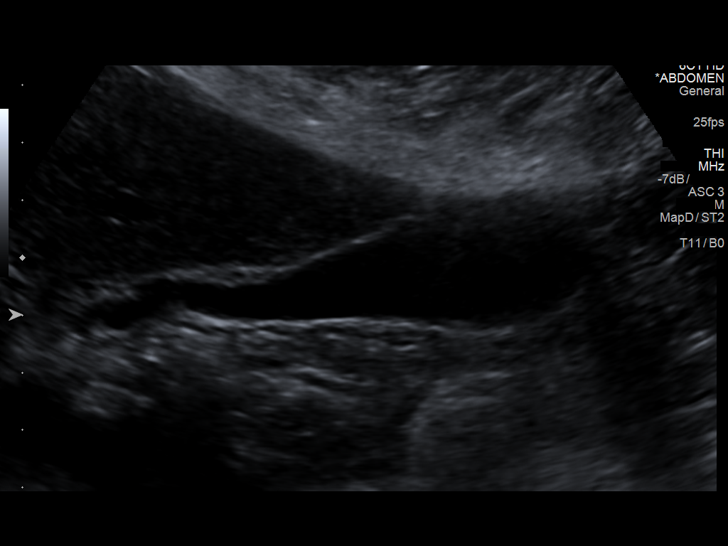
[im 74/111]
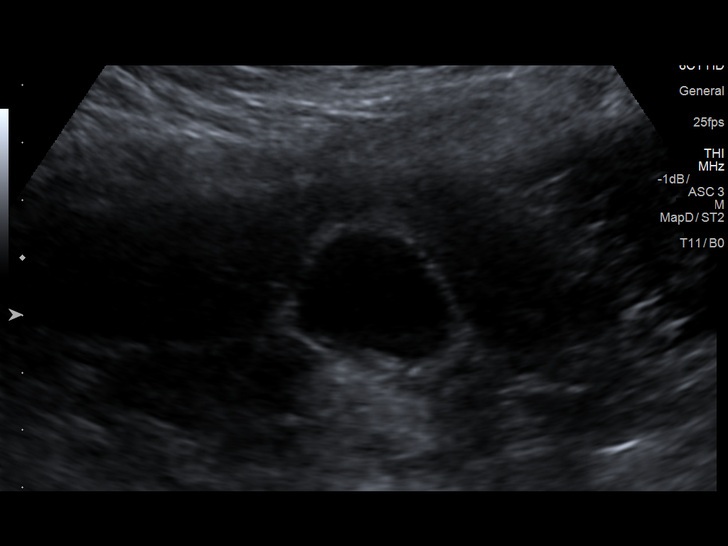
[im 83/111]
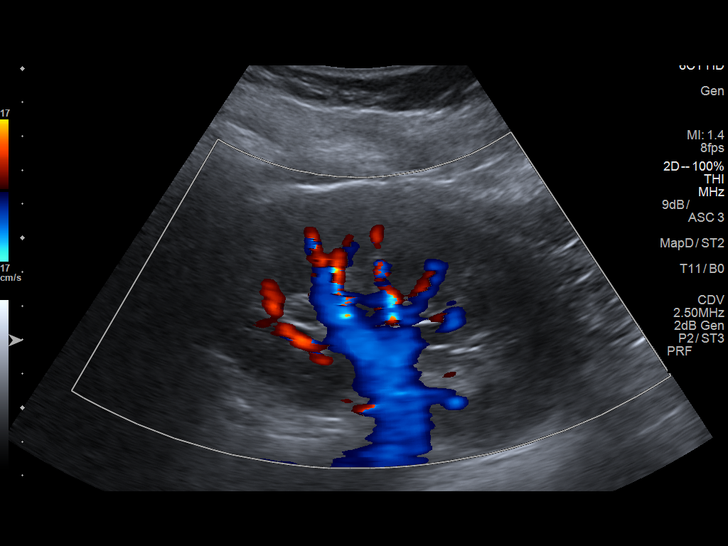
[im 92/111]
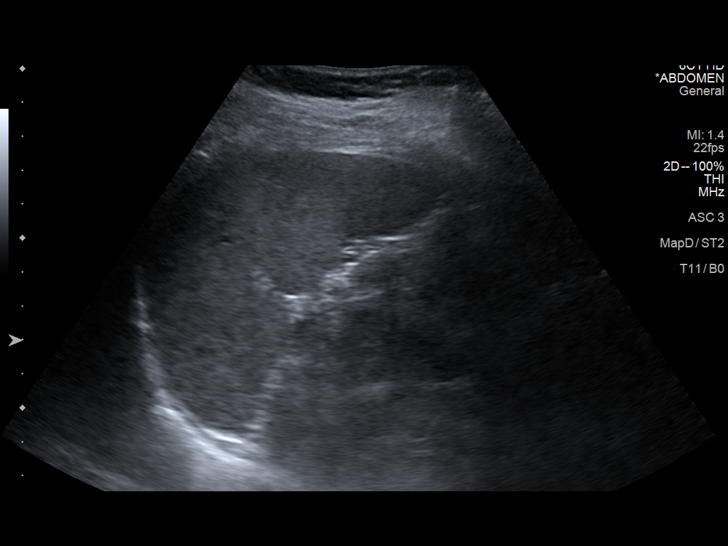
[im 101/111]
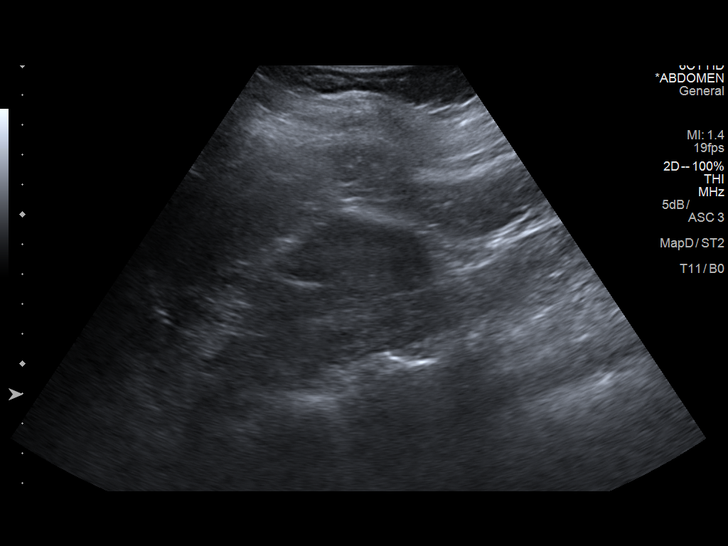
[im 111/111]
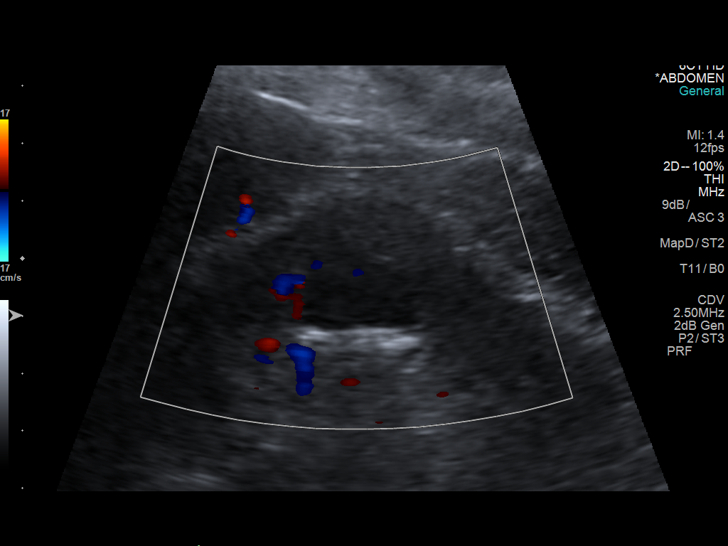

[14 of 25 positions shown; findings below may reference images not displayed]

FINDINGS: Gallbladder: No gallstones or wall thickening visualized. No
sonographic Murphy sign noted by sonographer.

Common bile duct: Diameter: 1.5 mm.

Liver: No focal lesion identified. Within normal limits in
parenchymal echogenicity. Portal vein is patent on color Doppler
imaging with normal direction of blood flow towards the liver.

IVC: No abnormality visualized.

Pancreas: Visualized portion unremarkable.

Spleen: Size and appearance within normal limits.

Right Kidney: Length: 10.6 cm. Echogenicity within normal limits. No
mass or hydronephrosis visualized.

Left Kidney: Length: 11.0 cm. Echogenicity within normal limits.
cm cyst over the mid pole a possible minimal dependent
calcification. No mass or hydronephrosis visualized.

Abdominal aorta: No aneurysm visualized.

Other findings: None.
IMPRESSION: No acute findings.

2.5 cm left renal cyst.

## 2019-05-03 ENCOUNTER — Ambulatory Visit (INDEPENDENT_AMBULATORY_CARE_PROVIDER_SITE_OTHER): Payer: Commercial Managed Care - PPO | Admitting: Medical

## 2019-05-03 ENCOUNTER — Encounter: Payer: Self-pay | Admitting: Medical

## 2019-05-03 ENCOUNTER — Other Ambulatory Visit: Payer: Self-pay

## 2019-05-03 VITALS — Wt 195.0 lb

## 2019-05-03 DIAGNOSIS — E039 Hypothyroidism, unspecified: Secondary | ICD-10-CM | POA: Diagnosis not present

## 2019-05-03 DIAGNOSIS — E785 Hyperlipidemia, unspecified: Secondary | ICD-10-CM | POA: Diagnosis not present

## 2019-05-03 NOTE — Progress Notes (Signed)
Subjective:    Patient ID: Holly Fry, female    DOB: 1986-06-24, 33 y.o.   MRN: [id number]  HPI  Virtual Visit via Video Note  I connected with Holly Fry on 05/03/19 at  1:20 PM EDT by a video enabled telemedicine application and verified that I am speaking with the correct person using two identifiers.  Location: Patient: home Provider: office   I discussed the limitations of evaluation and management by telemedicine and the availability of in person appointments. The patient expressed understanding and agreed to proceed.  History of Present Illness:   Hx of low thyroid. Pt has not had tsh and t4 over past year. Pt husband just joined [employer]. Will move to [place]. Pt needs to now has appropiate levels. She has about 2 weeks of meds left.  Hx of mild elevated ldl cholesterol. Will check that as well near future.     Observations/Objective:  General-no acute distress, pleasant, oriented. Lungs- on inspection lungs appear unlabored. Neck- no tracheal deviation or jvd on inspection. Neuro- gross motor function appears intact. Assessment and Plan:   Follow Up Instructions:    I discussed the assessment and treatment plan with the patient. The patient was provided an opportunity to ask questions and all were answered. The patient agreed with the plan and demonstrated an understanding of the instructions.   The patient was advised to call back or seek an in-person evaluation if the symptoms worsen or if the condition fails to improve as anticipated.  I provided 15 minutes of non-face-to-face time during this encounter.   Esperanza RichtersEdward Robben Jagiello, PA-C  Assessment and Plan:   Follow Up Instructions:    I discussed the assessment and treatment plan with the patient. The patient was provided an opportunity to ask questions and all were answered. The patient agreed with the plan and demonstrated an understanding of the instructions.   The patient was advised to call back  or seek an in-person evaluation if the symptoms worsen or if the condition fails to improve as anticipated.  I provided 15 minutes of non-face-to-face time during this encounter.   Esperanza RichtersEdward Somya Jauregui, PA-C   Review of Systems  Constitutional: Negative for chills, fatigue and fever.  Respiratory: Negative for cough, chest tightness, shortness of breath and wheezing.   Cardiovascular: Negative for chest pain and palpitations.  Gastrointestinal: Negative for abdominal pain.  Genitourinary: Negative for dysuria.  Musculoskeletal: Negative for back pain and myalgias.  Skin: Negative for rash.  Neurological: Negative for dizziness, weakness and light-headedness.  Hematological: Negative for adenopathy. Does not bruise/bleed easily.  Psychiatric/Behavioral: Negative for behavioral problems and confusion.   Past Medical History:  Diagnosis Date  . Allergy    mild allergies in [place].  Marland Kitchen. DVT (deep venous thrombosis) (HCC) 2018   in Right Hip area  . PE (pulmonary thromboembolism) (HCC)   . Thyroid disease      Social History   Socioeconomic History  . Marital status: Married    Spouse name: Not on file  . Number of children: Not on file  . Years of education: Not on file  . Highest education level: Not on file  Occupational History  . Not on file  Social Needs  . Financial resource strain: Not on file  . Food insecurity    Worry: Not on file    Inability: Not on file  . Transportation needs    Medical: Not on file    Non-medical: Not on file  Tobacco Use  .  Smoking status: Never Smoker  . Smokeless tobacco: Never Used  Substance and Sexual Activity  . Alcohol use: Yes    Comment: very rare alchohol use.  . Drug use: Never  . Sexual activity: Yes  Lifestyle  . Physical activity    Days per week: Not on file    Minutes per session: Not on file  . Stress: Not on file  Relationships  . Social Herbalist on phone: Not on file    Gets together: Not on file     Attends religious service: Not on file    Active member of club or organization: Not on file    Attends meetings of clubs or organizations: Not on file    Relationship status: Not on file  . Intimate partner violence    Fear of current or ex partner: Not on file    Emotionally abused: Not on file    Physically abused: Not on file    Forced sexual activity: Not on file  Other Topics Concern  . Not on file  Social History Narrative  . Not on file    Past Surgical History:  Procedure Laterality Date  . CESAREAN SECTION  2017  . LAPAROSCOPY  2016   endometriosis  . LASIK Bilateral 2016  . WISDOM TOOTH EXTRACTION  2007    No family history on file.  No Known Allergies  Current Outpatient Medications on File Prior to Visit  Medication Sig Dispense Refill  . aspirin 81 MG chewable tablet Chew by mouth daily.    Marland Kitchen levothyroxine (SYNTHROID, LEVOTHROID) 112 MCG tablet TAKE 1 TABLET (112 MCG TOTAL) BY MOUTH DAILY BEFORE BREAKFAST. 90 tablet 1  . ondansetron (ZOFRAN ODT) 8 MG disintegrating tablet Take 1 tablet (8 mg total) by mouth every 8 (eight) hours as needed for nausea or vomiting. 20 tablet 0  . ranitidine (ZANTAC) 150 MG capsule TAKE 1 CAPSULE BY MOUTH TWICE A DAY 60 capsule 0   No current facility-administered medications on file prior to visit.     Wt 195 lb (88.5 kg)   BMI 28.80 kg/m       Objective:   Physical Exam        Assessment & Plan:

## 2019-05-03 NOTE — Patient Instructions (Signed)
For history of hypothyroidism in 1 year or more since last labs for thyroid, I did place future labs.  Asking reception staff to give you call within the next couple days to get you scheduled for the labs within 2 weeks.  If no one calls you by Friday please call our office to arrange those labs.  We will refill your thyroid medication dosage accordingly.  For history of hyperlipidemia/mild high LDL, did go ahead and place future metabolic panel and lipid panel as well.  Reminder to get flu vaccine in September.  Follow-up with [place] as needed before you move [place].

## 2019-05-06 ENCOUNTER — Other Ambulatory Visit: Payer: Self-pay

## 2019-05-07 ENCOUNTER — Other Ambulatory Visit: Payer: Self-pay | Admitting: Medical

## 2019-05-09 ENCOUNTER — Other Ambulatory Visit (INDEPENDENT_AMBULATORY_CARE_PROVIDER_SITE_OTHER): Payer: Commercial Managed Care - PPO

## 2019-05-09 ENCOUNTER — Other Ambulatory Visit: Payer: Self-pay

## 2019-05-09 DIAGNOSIS — E785 Hyperlipidemia, unspecified: Secondary | ICD-10-CM

## 2019-05-09 DIAGNOSIS — E039 Hypothyroidism, unspecified: Secondary | ICD-10-CM | POA: Diagnosis not present

## 2019-05-09 DIAGNOSIS — D649 Anemia, unspecified: Secondary | ICD-10-CM | POA: Diagnosis not present

## 2019-05-09 LAB — COMPREHENSIVE METABOLIC PANEL
ALT: 13 U/L (ref 0–35)
AST: 15 U/L (ref 0–37)
Albumin: 4.3 g/dL (ref 3.5–5.2)
Alkaline Phosphatase: 48 U/L (ref 39–117)
BUN: 12 mg/dL (ref 6–23)
CO2: 26 mEq/L (ref 19–32)
Calcium: 9.5 mg/dL (ref 8.4–10.5)
Chloride: 106 mEq/L (ref 96–112)
Creatinine, Ser: 0.74 mg/dL (ref 0.40–1.20)
GFR: 91.37 mL/min (ref >60.00)
Glucose, Bld: 85 mg/dL (ref 70–99)
Potassium: 4.2 mEq/L (ref 3.5–5.1)
Sodium: 141 mEq/L (ref 135–145)
Total Bilirubin: 0.3 mg/dL (ref 0.2–1.2)
Total Protein: 7.1 g/dL (ref 6.0–8.3)

## 2019-05-09 LAB — CBC WITH DIFFERENTIAL/PLATELET
Basophils Absolute: 0 10*3/uL (ref 0.0–0.1)
Basophils Relative: 0.9 % (ref 0.0–3.0)
Eosinophils Absolute: 0.2 10*3/uL (ref 0.0–0.7)
Eosinophils Relative: 3 % (ref 0.0–5.0)
HCT: 36.6 % (ref 36.0–46.0)
Hemoglobin: 11.6 g/dL — ABNORMAL LOW (ref 12.0–15.0)
Lymphocytes Relative: 32.9 % (ref 12.0–46.0)
Lymphs Abs: 1.7 10*3/uL (ref 0.7–4.0)
MCHC: 31.6 g/dL (ref 30.0–36.0)
MCV: 72.5 fl — ABNORMAL LOW (ref 78.0–100.0)
Monocytes Absolute: 0.5 10*3/uL (ref 0.1–1.0)
Monocytes Relative: 9.5 % (ref 3.0–12.0)
Neutro Abs: 2.8 10*3/uL (ref 1.4–7.7)
Neutrophils Relative %: 53.7 % (ref 43.0–77.0)
Platelets: 361 10*3/uL (ref 150.0–400.0)
RBC: 5.05 Mil/uL (ref 3.87–5.11)
RDW: 18.7 % — ABNORMAL HIGH (ref 11.5–15.5)
WBC: 5.3 10*3/uL (ref 4.0–10.5)

## 2019-05-09 LAB — T4, FREE: Free T4: 0.91 ng/dL (ref 0.60–1.60)

## 2019-05-09 LAB — TSH: TSH: 1.82 u[IU]/mL (ref 0.35–4.50)

## 2019-05-09 LAB — LIPID PANEL
Cholesterol: 187 mg/dL (ref 0–200)
HDL: 48.7 mg/dL (ref >39.00)
LDL Cholesterol: 118 mg/dL — ABNORMAL HIGH (ref 0–99)
NonHDL: 138.45
Total CHOL/HDL Ratio: 4
Triglycerides: 100 mg/dL (ref 0.0–149.0)
VLDL: 20 mg/dL (ref 0.0–40.0)

## 2019-05-10 ENCOUNTER — Telehealth: Payer: Self-pay | Admitting: Medical

## 2019-05-10 DIAGNOSIS — D509 Iron deficiency anemia, unspecified: Secondary | ICD-10-CM

## 2019-05-10 LAB — IRON,TIBC AND FERRITIN PANEL
%SAT: 6 % (calc) — ABNORMAL LOW (ref 16–45)
Ferritin: 6 ng/mL — ABNORMAL LOW (ref 16–154)
Iron: 21 ug/dL — ABNORMAL LOW (ref 40–190)
TIBC: 342 mcg/dL (calc) (ref 250–450)

## 2019-05-10 MED ORDER — FERROUS SULFATE 325 (65 FE) MG PO TBEC: 325.0000 mg | DELAYED_RELEASE_TABLET | Freq: Every day | ORAL | 3 refills | Status: DC

## 2019-05-10 NOTE — Telephone Encounter (Signed)
Future cbc and iron studies placed.

## 2019-08-04 ENCOUNTER — Other Ambulatory Visit: Payer: Self-pay | Admitting: Medical

## 2019-11-24 ENCOUNTER — Telehealth: Payer: Self-pay | Admitting: Medical

## 2019-11-24 MED ORDER — LEVOTHYROXINE SODIUM 112 MCG PO TABS: 112.0000 ug | ORAL_TABLET | Freq: Every day | ORAL | 1 refills | Status: DC

## 2019-11-24 NOTE — Telephone Encounter (Signed)
Medication: levothyroxine (SYNTHROID) 112 MCG tablet  Has the patient contacted their pharmacy? Yes.   (If no, request that the patient contact the pharmacy for the refill.) (If yes, when and what did the pharmacy advise?)  Pt wants script expedited due to her going on vacation tomorrow. (No Guarantee made)  Preferred Pharmacy (with phone number or street name):  [facility]  [address]  [place]  [place] [address]  [phone]    Agent: Please be advised that RX refills may take up to 3 business days. We ask that you follow-up with your pharmacy.

## 2019-11-24 NOTE — Telephone Encounter (Signed)
Left message on machine that medication has been sent in. °

## 2020-05-22 ENCOUNTER — Other Ambulatory Visit: Payer: Self-pay | Admitting: Medical
